# Patient Record
Sex: Male | Born: 2000 | Hispanic: No | Marital: Single | Smoking: Never smoker
Health system: Southern US, Community
[De-identification: ages and names within clinical notes are randomized; demographics above are authoritative.]

## PROBLEM LIST (undated history)

## (undated) DIAGNOSIS — Z789 Other specified health status: Secondary | ICD-10-CM

## (undated) HISTORY — DX: Other specified health status: Z78.9

---
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
## 2019-03-27 DIAGNOSIS — Z20828 Contact with and (suspected) exposure to other viral communicable diseases: Secondary | ICD-10-CM | POA: Diagnosis not present

## 2019-04-02 DIAGNOSIS — Z20828 Contact with and (suspected) exposure to other viral communicable diseases: Secondary | ICD-10-CM | POA: Diagnosis not present

## 2019-04-09 DIAGNOSIS — Z20828 Contact with and (suspected) exposure to other viral communicable diseases: Secondary | ICD-10-CM | POA: Diagnosis not present

## 2019-07-16 ENCOUNTER — Other Ambulatory Visit: Payer: Self-pay

## 2019-07-16 ENCOUNTER — Encounter (HOSPITAL_COMMUNITY): Payer: Self-pay | Admitting: Emergency Medicine

## 2019-07-16 ENCOUNTER — Inpatient Hospital Stay (HOSPITAL_COMMUNITY)
Admission: EM | Admit: 2019-07-16 | Discharge: 2019-07-20 | DRG: 343 | Disposition: A | Discharge status: Home / Self Care | Payer: Medicaid Other | Attending: Surgery | Admitting: Surgery

## 2019-07-16 DIAGNOSIS — K3532 Acute appendicitis with perforation and localized peritonitis, without abscess: Secondary | ICD-10-CM | POA: Diagnosis present

## 2019-07-16 DIAGNOSIS — K3580 Unspecified acute appendicitis: Secondary | ICD-10-CM | POA: Diagnosis present

## 2019-07-16 DIAGNOSIS — F17200 Nicotine dependence, unspecified, uncomplicated: Secondary | ICD-10-CM | POA: Diagnosis present

## 2019-07-16 DIAGNOSIS — Z8349 Family history of other endocrine, nutritional and metabolic diseases: Secondary | ICD-10-CM

## 2019-07-16 DIAGNOSIS — Z03818 Encounter for observation for suspected exposure to other biological agents ruled out: Secondary | ICD-10-CM | POA: Diagnosis not present

## 2019-07-16 DIAGNOSIS — Z8249 Family history of ischemic heart disease and other diseases of the circulatory system: Secondary | ICD-10-CM

## 2019-07-16 DIAGNOSIS — Z833 Family history of diabetes mellitus: Secondary | ICD-10-CM

## 2019-07-16 DIAGNOSIS — K352 Acute appendicitis with generalized peritonitis, without abscess: Secondary | ICD-10-CM

## 2019-07-16 DIAGNOSIS — R1031 Right lower quadrant pain: Secondary | ICD-10-CM

## 2019-07-16 DIAGNOSIS — R1110 Vomiting, unspecified: Secondary | ICD-10-CM

## 2019-07-16 DIAGNOSIS — Z20828 Contact with and (suspected) exposure to other viral communicable diseases: Secondary | ICD-10-CM | POA: Diagnosis present

## 2019-07-16 DIAGNOSIS — Z8619 Personal history of other infectious and parasitic diseases: Secondary | ICD-10-CM

## 2019-07-16 LAB — COMPREHENSIVE METABOLIC PANEL
ALT: 16 U/L (ref 0–44)
AST: 36 U/L (ref 15–41)
Albumin: 4.7 g/dL (ref 3.5–5.0)
Alkaline Phosphatase: 63 U/L (ref 38–126)
Anion gap: 13 (ref 5–15)
BUN: 8 mg/dL (ref 6–20)
CO2: 24 mmol/L (ref 22–32)
Calcium: 9.6 mg/dL (ref 8.9–10.3)
Chloride: 101 mmol/L (ref 98–111)
Creatinine, Ser: 1.15 mg/dL (ref 0.61–1.24)
GFR calc Af Amer: >60 mL/min (ref >60)
GFR calc non Af Amer: >60 mL/min (ref >60)
Glucose, Bld: 117 mg/dL — ABNORMAL HIGH (ref 70–99)
Potassium: 4.7 mmol/L (ref 3.5–5.1)
Sodium: 138 mmol/L (ref 135–145)
Total Bilirubin: 1.7 mg/dL — ABNORMAL HIGH (ref 0.3–1.2)
Total Protein: 7.7 g/dL (ref 6.5–8.1)

## 2019-07-16 LAB — CBC
HCT: 48.1 % (ref 39.0–52.0)
Hemoglobin: 16.4 g/dL (ref 13.0–17.0)
MCH: 31.6 pg (ref 26.0–34.0)
MCHC: 34.1 g/dL (ref 30.0–36.0)
MCV: 92.7 fL (ref 80.0–100.0)
Platelets: 239 10*3/uL (ref 150–400)
RBC: 5.19 MIL/uL (ref 4.22–5.81)
RDW: 11.8 % (ref 11.5–15.5)
WBC: 11.8 10*3/uL — ABNORMAL HIGH (ref 4.0–10.5)
nRBC: 0 % (ref 0.0–0.2)

## 2019-07-16 LAB — URINALYSIS, ROUTINE W REFLEX MICROSCOPIC
Bilirubin Urine: NEGATIVE
Glucose, UA: NEGATIVE mg/dL
Hgb urine dipstick: NEGATIVE
Ketones, ur: 80 mg/dL — AB
Leukocytes,Ua: NEGATIVE
Nitrite: NEGATIVE
Protein, ur: NEGATIVE mg/dL
Specific Gravity, Urine: 1.023 (ref 1.005–1.030)
pH: 7 (ref 5.0–8.0)

## 2019-07-16 LAB — LIPASE, BLOOD: Lipase: 22 U/L (ref 11–51)

## 2019-07-16 MED ORDER — SODIUM CHLORIDE 0.9% FLUSH: 3.0000 mL | Freq: Once | INTRAVENOUS | Status: DC

## 2019-07-16 MED ORDER — ONDANSETRON 4 MG PO TBDP
4.0000 mg | ORAL_TABLET | Freq: Once | ORAL | Status: AC | PRN
  Administered 2019-07-16: 4 mg via ORAL
  Filled 2019-07-16: qty 1

## 2019-07-16 NOTE — ED Notes (Signed)
Pt approached this tech stating that he was feeling really bad and felt like he was going to pass out. RN, Kathie Rhodes., notified. Pt's vitals were reassessed and he is currently sitting with this tech at the nurses station.

## 2019-07-16 NOTE — ED Triage Notes (Signed)
Pt has had abdominal pain and emesis since last night.  OTC medications have not assisted him.

## 2019-07-17 ENCOUNTER — Encounter (HOSPITAL_COMMUNITY): Payer: Self-pay | Admitting: Orthopedic Surgery

## 2019-07-17 ENCOUNTER — Emergency Department (HOSPITAL_COMMUNITY): Payer: Medicaid Other

## 2019-07-17 ENCOUNTER — Observation Stay (HOSPITAL_COMMUNITY): Payer: Medicaid Other | Admitting: Anesthesiology

## 2019-07-17 ENCOUNTER — Encounter (HOSPITAL_COMMUNITY): Admission: EM | Disposition: A | Discharge status: Home / Self Care | Payer: Self-pay | Source: Home / Self Care

## 2019-07-17 DIAGNOSIS — K3580 Unspecified acute appendicitis: Secondary | ICD-10-CM | POA: Diagnosis present

## 2019-07-17 DIAGNOSIS — K3589 Other acute appendicitis without perforation or gangrene: Secondary | ICD-10-CM | POA: Diagnosis not present

## 2019-07-17 DIAGNOSIS — R1031 Right lower quadrant pain: Secondary | ICD-10-CM | POA: Diagnosis not present

## 2019-07-17 DIAGNOSIS — K3532 Acute appendicitis with perforation and localized peritonitis, without abscess: Secondary | ICD-10-CM | POA: Diagnosis not present

## 2019-07-17 HISTORY — PX: LAPAROSCOPIC APPENDECTOMY: SHX408

## 2019-07-17 LAB — SARS CORONAVIRUS 2 (TAT 6-24 HRS): SARS Coronavirus 2: NEGATIVE

## 2019-07-17 LAB — HIV ANTIBODY (ROUTINE TESTING W REFLEX): HIV Screen 4th Generation wRfx: NONREACTIVE

## 2019-07-17 SURGERY — APPENDECTOMY, LAPAROSCOPIC
Anesthesia: General | Site: Abdomen

## 2019-07-17 MED ORDER — BUPIVACAINE-EPINEPHRINE (PF) 0.5% -1:200000 IJ SOLN
INTRAMUSCULAR | Status: DC | PRN
  Administered 2019-07-17: 7 mL

## 2019-07-17 MED ORDER — LIDOCAINE HCL (CARDIAC) PF 100 MG/5ML IV SOSY
PREFILLED_SYRINGE | INTRAVENOUS | Status: DC | PRN
  Administered 2019-07-17: 200 mg via INTRAVENOUS

## 2019-07-17 MED ORDER — MORPHINE SULFATE (PF) 2 MG/ML IV SOLN
2.0000 mg | INTRAVENOUS | Status: DC | PRN
  Administered 2019-07-17: 2 mg via INTRAVENOUS
  Filled 2019-07-17: qty 1

## 2019-07-17 MED ORDER — SODIUM CHLORIDE 0.9 % IV SOLN
2.0000 g | INTRAVENOUS | Status: DC
  Administered 2019-07-17: 2 g via INTRAVENOUS
  Filled 2019-07-17: qty 20

## 2019-07-17 MED ORDER — BUPIVACAINE-EPINEPHRINE 0.5% -1:200000 IJ SOLN
INTRAMUSCULAR | Status: AC
  Filled 2019-07-17: qty 1

## 2019-07-17 MED ORDER — ROCURONIUM BROMIDE 50 MG/5ML IV SOSY
PREFILLED_SYRINGE | INTRAVENOUS | Status: DC | PRN
  Administered 2019-07-17: 50 mg via INTRAVENOUS

## 2019-07-17 MED ORDER — SUGAMMADEX SODIUM 200 MG/2ML IV SOLN
INTRAVENOUS | Status: DC | PRN
  Administered 2019-07-17: 200 mg via INTRAVENOUS

## 2019-07-17 MED ORDER — HYDROMORPHONE HCL 1 MG/ML IJ SOLN
INTRAMUSCULAR | Status: AC
  Administered 2019-07-17: 0.5 mg via INTRAVENOUS
  Filled 2019-07-17: qty 1

## 2019-07-17 MED ORDER — ONDANSETRON HCL 4 MG/2ML IJ SOLN: 4.0000 mg | Freq: Four times a day (QID) | INTRAMUSCULAR | Status: DC | PRN

## 2019-07-17 MED ORDER — SODIUM CHLORIDE 0.9 % IV SOLN
INTRAVENOUS | Status: DC
  Administered 2019-07-17 – 2019-07-18 (×3): via INTRAVENOUS

## 2019-07-17 MED ORDER — SODIUM CHLORIDE 0.9 % IV SOLN
INTRAVENOUS | Status: DC
  Administered 2019-07-19: 15:00:00 via INTRAVENOUS

## 2019-07-17 MED ORDER — METRONIDAZOLE IN NACL 5-0.79 MG/ML-% IV SOLN
500.0000 mg | Freq: Three times a day (TID) | INTRAVENOUS | Status: DC
  Administered 2019-07-17: 500 mg via INTRAVENOUS
  Filled 2019-07-17: qty 100

## 2019-07-17 MED ORDER — SODIUM CHLORIDE 0.9 % IV SOLN
INTRAVENOUS | Status: DC
  Administered 2019-07-17: 12:00:00 via INTRAVENOUS

## 2019-07-17 MED ORDER — PIPERACILLIN-TAZOBACTAM 3.375 G IVPB
3.3750 g | Freq: Three times a day (TID) | INTRAVENOUS | Status: DC
  Administered 2019-07-17 – 2019-07-20 (×8): 3.375 g via INTRAVENOUS
  Filled 2019-07-17 (×8): qty 50

## 2019-07-17 MED ORDER — SODIUM CHLORIDE 0.9 % IV BOLUS
1000.0000 mL | Freq: Once | INTRAVENOUS | Status: AC
  Administered 2019-07-17: 1000 mL via INTRAVENOUS

## 2019-07-17 MED ORDER — IOHEXOL 300 MG/ML  SOLN
100.0000 mL | Freq: Once | INTRAMUSCULAR | Status: AC | PRN
  Administered 2019-07-17: 100 mL via INTRAVENOUS

## 2019-07-17 MED ORDER — ENOXAPARIN SODIUM 40 MG/0.4ML ~~LOC~~ SOLN
40.0000 mg | SUBCUTANEOUS | Status: DC
  Administered 2019-07-18 – 2019-07-20 (×3): 40 mg via SUBCUTANEOUS
  Filled 2019-07-17 (×3): qty 0.4

## 2019-07-17 MED ORDER — DEXAMETHASONE SODIUM PHOSPHATE 10 MG/ML IJ SOLN
INTRAMUSCULAR | Status: DC | PRN
  Administered 2019-07-17: 10 mg via INTRAVENOUS

## 2019-07-17 MED ORDER — DOCUSATE SODIUM 100 MG PO CAPS: 100.0000 mg | ORAL_CAPSULE | Freq: Two times a day (BID) | ORAL | Status: DC

## 2019-07-17 MED ORDER — LIDOCAINE 2% (20 MG/ML) 5 ML SYRINGE
INTRAMUSCULAR | Status: DC | PRN
  Administered 2019-07-17: 100 mg via INTRAVENOUS

## 2019-07-17 MED ORDER — FENTANYL CITRATE (PF) 250 MCG/5ML IJ SOLN
INTRAMUSCULAR | Status: AC
  Filled 2019-07-17: qty 5

## 2019-07-17 MED ORDER — METHOCARBAMOL 500 MG PO TABS: 500.0000 mg | ORAL_TABLET | Freq: Four times a day (QID) | ORAL | Status: DC | PRN

## 2019-07-17 MED ORDER — ONDANSETRON 4 MG PO TBDP: 4.0000 mg | ORAL_TABLET | Freq: Four times a day (QID) | ORAL | Status: DC | PRN

## 2019-07-17 MED ORDER — ACETAMINOPHEN 650 MG RE SUPP: 650.0000 mg | Freq: Four times a day (QID) | RECTAL | Status: DC | PRN

## 2019-07-17 MED ORDER — MEPERIDINE HCL 25 MG/ML IJ SOLN: 6.2500 mg | INTRAMUSCULAR | Status: DC | PRN

## 2019-07-17 MED ORDER — DIPHENHYDRAMINE HCL 25 MG PO CAPS: 25.0000 mg | ORAL_CAPSULE | Freq: Four times a day (QID) | ORAL | Status: DC | PRN

## 2019-07-17 MED ORDER — KETOROLAC TROMETHAMINE 30 MG/ML IJ SOLN
INTRAMUSCULAR | Status: DC | PRN
  Administered 2019-07-17: 30 mg via INTRAVENOUS

## 2019-07-17 MED ORDER — PROMETHAZINE HCL 25 MG/ML IJ SOLN: 6.2500 mg | INTRAMUSCULAR | Status: DC | PRN

## 2019-07-17 MED ORDER — FENTANYL CITRATE (PF) 100 MCG/2ML IJ SOLN
50.0000 ug | INTRAMUSCULAR | Status: DC | PRN
  Administered 2019-07-17: 150 ug via INTRAVENOUS

## 2019-07-17 MED ORDER — METHOCARBAMOL 500 MG PO TABS
500.0000 mg | ORAL_TABLET | Freq: Four times a day (QID) | ORAL | Status: DC | PRN
  Administered 2019-07-17 – 2019-07-18 (×3): 500 mg via ORAL
  Filled 2019-07-17 (×3): qty 1

## 2019-07-17 MED ORDER — POLYETHYLENE GLYCOL 3350 17 G PO PACK: 17.0000 g | PACK | Freq: Every day | ORAL | Status: DC | PRN

## 2019-07-17 MED ORDER — ACETAMINOPHEN 500 MG PO TABS
1000.0000 mg | ORAL_TABLET | Freq: Four times a day (QID) | ORAL | Status: DC
  Administered 2019-07-17 – 2019-07-20 (×10): 1000 mg via ORAL
  Filled 2019-07-17 (×10): qty 2

## 2019-07-17 MED ORDER — KETOROLAC TROMETHAMINE 15 MG/ML IJ SOLN: 15.0000 mg | Freq: Four times a day (QID) | INTRAMUSCULAR | Status: DC | PRN

## 2019-07-17 MED ORDER — HYDROMORPHONE HCL 1 MG/ML IJ SOLN
0.2500 mg | INTRAMUSCULAR | Status: DC | PRN
  Administered 2019-07-17 (×2): 0.5 mg via INTRAVENOUS

## 2019-07-17 MED ORDER — METOPROLOL TARTRATE 5 MG/5ML IV SOLN: 5.0000 mg | Freq: Four times a day (QID) | INTRAVENOUS | Status: DC | PRN

## 2019-07-17 MED ORDER — ONDANSETRON HCL 4 MG/2ML IJ SOLN
INTRAMUSCULAR | Status: DC | PRN
  Administered 2019-07-17: 4 mg via INTRAVENOUS

## 2019-07-17 MED ORDER — 0.9 % SODIUM CHLORIDE (POUR BTL) OPTIME
TOPICAL | Status: DC | PRN
  Administered 2019-07-17: 1000 mL

## 2019-07-17 MED ORDER — SIMETHICONE 80 MG PO CHEW
40.0000 mg | CHEWABLE_TABLET | Freq: Four times a day (QID) | ORAL | Status: DC | PRN
  Administered 2019-07-18 (×2): 40 mg via ORAL
  Filled 2019-07-17 (×2): qty 1

## 2019-07-17 MED ORDER — SODIUM CHLORIDE 0.9 % IR SOLN
Status: DC | PRN
  Administered 2019-07-17: 1000 mL

## 2019-07-17 MED ORDER — DIPHENHYDRAMINE HCL 50 MG/ML IJ SOLN: 25.0000 mg | Freq: Four times a day (QID) | INTRAMUSCULAR | Status: DC | PRN

## 2019-07-17 MED ORDER — ENOXAPARIN SODIUM 40 MG/0.4ML ~~LOC~~ SOLN
40.0000 mg | SUBCUTANEOUS | Status: DC
  Administered 2019-07-17: 40 mg via SUBCUTANEOUS
  Filled 2019-07-17: qty 0.4

## 2019-07-17 MED ORDER — PANTOPRAZOLE SODIUM 40 MG IV SOLR: 40.0000 mg | Freq: Every day | INTRAVENOUS | Status: DC

## 2019-07-17 MED ORDER — PROPOFOL 10 MG/ML IV BOLUS
INTRAVENOUS | Status: AC
  Filled 2019-07-17: qty 20

## 2019-07-17 MED ORDER — ACETAMINOPHEN 325 MG PO TABS: 650.0000 mg | ORAL_TABLET | Freq: Four times a day (QID) | ORAL | Status: DC | PRN

## 2019-07-17 MED ORDER — OXYCODONE HCL 5 MG PO TABS
5.0000 mg | ORAL_TABLET | ORAL | Status: DC | PRN
  Administered 2019-07-18 – 2019-07-20 (×7): 10 mg via ORAL
  Filled 2019-07-17 (×7): qty 2

## 2019-07-17 MED ORDER — MORPHINE SULFATE (PF) 2 MG/ML IV SOLN: 2.0000 mg | INTRAVENOUS | Status: DC | PRN

## 2019-07-17 MED ORDER — MIDAZOLAM HCL 2 MG/2ML IJ SOLN
INTRAMUSCULAR | Status: AC
  Filled 2019-07-17: qty 2

## 2019-07-17 MED ORDER — ONDANSETRON HCL 4 MG/2ML IJ SOLN
4.0000 mg | Freq: Once | INTRAMUSCULAR | Status: AC
  Administered 2019-07-17: 4 mg via INTRAVENOUS
  Filled 2019-07-17: qty 2

## 2019-07-17 MED ORDER — SIMETHICONE 80 MG PO CHEW: 40.0000 mg | CHEWABLE_TABLET | Freq: Four times a day (QID) | ORAL | Status: DC | PRN

## 2019-07-17 MED ORDER — CEFAZOLIN SODIUM-DEXTROSE 2-3 GM-%(50ML) IV SOLR
INTRAVENOUS | Status: DC | PRN
  Administered 2019-07-17: 2 g via INTRAVENOUS

## 2019-07-17 MED ORDER — OXYCODONE HCL 5 MG PO TABS: 5.0000 mg | ORAL_TABLET | ORAL | Status: DC | PRN

## 2019-07-17 MED ORDER — LACTATED RINGERS IV SOLN
INTRAVENOUS | Status: DC
  Administered 2019-07-17: 16:00:00 via INTRAVENOUS

## 2019-07-17 SURGICAL SUPPLY — 42 items
APPLIER CLIP ROT 10 11.4 M/L (STAPLE)
CANISTER SUCT 3000ML PPV (MISCELLANEOUS) ×2 IMPLANT
CHLORAPREP W/TINT 26 (MISCELLANEOUS) ×2 IMPLANT
CLIP APPLIE ROT 10 11.4 M/L (STAPLE) IMPLANT
CLSR STERI-STRIP ANTIMIC 1/2X4 (GAUZE/BANDAGES/DRESSINGS) ×2 IMPLANT
COVER SURGICAL LIGHT HANDLE (MISCELLANEOUS) ×2 IMPLANT
COVER WAND RF STERILE (DRAPES) IMPLANT
CUTTER FLEX LINEAR 45M (STAPLE) ×2 IMPLANT
DERMABOND ADVANCED (GAUZE/BANDAGES/DRESSINGS) ×1
DERMABOND ADVANCED .7 DNX12 (GAUZE/BANDAGES/DRESSINGS) ×1 IMPLANT
ELECT REM PT RETURN 9FT ADLT (ELECTROSURGICAL) ×2
ELECTRODE REM PT RTRN 9FT ADLT (ELECTROSURGICAL) ×1 IMPLANT
GLOVE BIO SURGEON STRL SZ7 (GLOVE) ×2 IMPLANT
GLOVE BIOGEL PI IND STRL 7.5 (GLOVE) ×1 IMPLANT
GLOVE BIOGEL PI INDICATOR 7.5 (GLOVE) ×1
GOWN STRL REUS W/ TWL LRG LVL3 (GOWN DISPOSABLE) ×3 IMPLANT
GOWN STRL REUS W/TWL LRG LVL3 (GOWN DISPOSABLE) ×3
GRASPER SUT TROCAR 14GX15 (MISCELLANEOUS) ×2 IMPLANT
KIT BASIN OR (CUSTOM PROCEDURE TRAY) ×2 IMPLANT
KIT TURNOVER KIT B (KITS) ×2 IMPLANT
NS IRRIG 1000ML POUR BTL (IV SOLUTION) ×2 IMPLANT
PAD ARMBOARD 7.5X6 YLW CONV (MISCELLANEOUS) ×4 IMPLANT
POUCH RETRIEVAL ECOSAC 10 (ENDOMECHANICALS) ×1 IMPLANT
POUCH RETRIEVAL ECOSAC 10MM (ENDOMECHANICALS) ×1
RELOAD 45 VASCULAR/THIN (ENDOMECHANICALS) IMPLANT
RELOAD STAPLE TA45 3.5 REG BLU (ENDOMECHANICALS) ×2 IMPLANT
SCISSORS LAP 5X35 DISP (ENDOMECHANICALS) IMPLANT
SET IRRIG TUBING LAPAROSCOPIC (IRRIGATION / IRRIGATOR) ×2 IMPLANT
SET TUBE SMOKE EVAC HIGH FLOW (TUBING) ×2 IMPLANT
SHEARS HARMONIC ACE PLUS 36CM (ENDOMECHANICALS) ×2 IMPLANT
SLEEVE ENDOPATH XCEL 5M (ENDOMECHANICALS) ×2 IMPLANT
SPECIMEN JAR SMALL (MISCELLANEOUS) ×2 IMPLANT
STRIP CLOSURE SKIN 1/2X4 (GAUZE/BANDAGES/DRESSINGS) ×2 IMPLANT
SUT MNCRL AB 4-0 PS2 18 (SUTURE) ×2 IMPLANT
SUT VICRYL 0 UR6 27IN ABS (SUTURE) ×2 IMPLANT
TOWEL GREEN STERILE (TOWEL DISPOSABLE) ×2 IMPLANT
TOWEL GREEN STERILE FF (TOWEL DISPOSABLE) ×2 IMPLANT
TRAY FOLEY MTR SLVR 16FR STAT (SET/KITS/TRAYS/PACK) IMPLANT
TRAY LAPAROSCOPIC MC (CUSTOM PROCEDURE TRAY) ×2 IMPLANT
TROCAR XCEL BLUNT TIP 100MML (ENDOMECHANICALS) ×2 IMPLANT
TROCAR XCEL NON-BLD 5MMX100MML (ENDOMECHANICALS) ×2 IMPLANT
WATER STERILE IRR 1000ML POUR (IV SOLUTION) ×2 IMPLANT

## 2019-07-17 NOTE — Anesthesia Preprocedure Evaluation (Signed)
Anesthesia Evaluation  Patient identified by MRN, date of birth, ID band Patient awake    Reviewed: Allergy & Precautions, NPO status , Patient's Chart, lab work & pertinent test results  Airway Mallampati: I  TM Distance: >3 FB Neck ROM: Full    Dental no notable dental hx. (+) Dental Advisory Given   Pulmonary neg pulmonary ROS,    Pulmonary exam normal breath sounds clear to auscultation       Cardiovascular negative cardio ROS Normal cardiovascular exam Rhythm:Regular Rate:Normal     Neuro/Psych negative neurological ROS  negative psych ROS   GI/Hepatic negative GI ROS, Neg liver ROS,   Endo/Other  negative endocrine ROS  Renal/GU negative Renal ROS     Musculoskeletal negative musculoskeletal ROS (+)   Abdominal   Peds  Hematology negative hematology ROS (+)   Anesthesia Other Findings   Reproductive/Obstetrics                             Anesthesia Physical Anesthesia Plan  ASA: I and emergent  Anesthesia Plan: General   Post-op Pain Management:    Induction: Intravenous, Rapid sequence and Cricoid pressure planned  PONV Risk Score and Plan: 4 or greater and Ondansetron, Dexamethasone, Midazolam and Treatment may vary due to age or medical condition  Airway Management Planned: Oral ETT  Additional Equipment: None  Intra-op Plan:   Post-operative Plan: Extubation in OR  Informed Consent: I have reviewed the patients History and Physical, chart, labs and discussed the procedure including the risks, benefits and alternatives for the proposed anesthesia with the patient or authorized representative who has indicated his/her understanding and acceptance.     Dental advisory given  Plan Discussed with: CRNA  Anesthesia Plan Comments:         Anesthesia Quick Evaluation

## 2019-07-17 NOTE — Progress Notes (Signed)
Received pt here in 6N from PACU, alert orientedx4 with vitals signs stable. Lap sites clean and dry, pt oriented to room, bed controls and plan of care. Left lying comfortably in bed with call bell at reach, will continue to monitor.

## 2019-07-17 NOTE — ED Notes (Signed)
Surgery with patient in triage.

## 2019-07-17 NOTE — Interval H&P Note (Signed)
History and Physical Interval Note:  07/17/2019 4:45 PM I have seen and examined patient. Reviewed imaging plan for lap appy Lee Santos  has presented today for surgery, with the diagnosis of acute appendicitis.  The various methods of treatment have been discussed with the patient and family. After consideration of risks, benefits and other options for treatment, the patient has consented to  Procedure(s): APPENDECTOMY LAPAROSCOPIC (N/A) as a surgical intervention.  The patient's history has been reviewed, patient examined, no change in status, stable for surgery.  I have reviewed the patient's chart and labs.  Questions were answered to the patient's satisfaction.     Rolm Bookbinder

## 2019-07-17 NOTE — Transfer of Care (Signed)
Immediate Anesthesia Transfer of Care Note  Patient: Lee Santos  Procedure(s) Performed: APPENDECTOMY LAPAROSCOPIC (N/A Abdomen)  Patient Location: PACU  Anesthesia Type:General  Level of Consciousness: awake, alert  and oriented  Airway & Oxygen Therapy: Patient Spontanous Breathing  Post-op Assessment: Report given to RN and Post -op Vital signs reviewed and stable  Post vital signs: Reviewed and stable  Last Vitals:  Vitals Value Taken Time  BP 126/57 07/17/19 1754  Temp    Pulse 96 07/17/19 1755  Resp 19 07/17/19 1755  SpO2 100 % 07/17/19 1755  Vitals shown include unvalidated device data.  Last Pain:  Vitals:   07/17/19 1321  TempSrc: Oral  PainSc:          Complications: No apparent anesthesia complications

## 2019-07-17 NOTE — Op Note (Signed)
Preoperative diagnosis:acute appendicitis Postoperative diagnosis: perforated appendicitis  Procedure: Laparoscopic appendectomy Surgeon: Dr. Serita Grammes Anesthesia: General Specimens: appendix to pathology Complications: None Drains: none Sponge needle count was correct at completion Disposition to recovery stable condition  Indications: This is a27 yom with rlq pain, elevated wbc and a ct scan that shows appendicitis. I recommended proceeding with appendectomy today.   Procedure: After informed consent was obtained the patientwas taken to the OR. Antibiotics had been given. SCDs were in place. He was then prepped and draped in the standard sterile surgical fashion after already undergoing general anesthesia.Surgical timeout was then performed.  Iinfiltrated Marcaine below the umbilicus. I made a vertical incision. I grasped the fascia and incised this sharply. The peritoneum was entered bluntly. I then placed a 0 Vicryl pursestring suture through the fascia. I inserted a Hassan trocar and insufflated the abdomen to 15 mmHg pressure.  I then inserted 2 additional 5 mm trocars in the lower abdomen under direct vision.  The appendiceal base was easily visualized. I did have to use the harmonic scalpel to take down the white line to medialize the cecum.  The appendix was retrocecal and going cephalad towards the liver. The distal half was necrotic.  I used the harmonic scalpel to divide the mesentery.  I then used a GIA stapler to divide the base.  The base was healthy.  I then placed this in a retrieval bag and removed it from the umbilicus.  Hemostasis was then observed.  I irrigated. I then removed the [facility] trocar and tied down my pursestring.  I did placeadditional 0 Vicryl sutures using the suture passer device at the umbilicus. The remaining trocars were removed and the abdomen was desufflated. These were all closed with 4-0 Monocryl, glue, and Steri-Strips. He  tolerated this well was extubated and transferred to recovery stable.

## 2019-07-17 NOTE — Anesthesia Procedure Notes (Signed)
Procedure Name: Intubation Date/Time: 07/17/2019 5:01 PM Performed by: Eligha Bridegroom, CRNA Pre-anesthesia Checklist: Patient identified, Emergency Drugs available, Suction available, Patient being monitored and Timeout performed Patient Re-evaluated:Patient Re-evaluated prior to induction Oxygen Delivery Method: Circle system utilized Preoxygenation: Pre-oxygenation with 100% oxygen Induction Type: IV induction Ventilation: Mask ventilation without difficulty Laryngoscope Size: Mac and 3 Grade View: Grade I Number of attempts: 1 Airway Equipment and Method: Stylet Placement Confirmation: ETT inserted through vocal cords under direct vision,  positive ETCO2 and breath sounds checked- equal and bilateral Secured at: 21 cm Tube secured with: Tape Dental Injury: Teeth and Oropharynx as per pre-operative assessment

## 2019-07-17 NOTE — H&P (Signed)
[facility] Surgery Admission Note  Lee Santos 09-21-2000  [id number].    Requesting MD: Dr. Reather Converse  Chief Complaint: RLQ abdominal pain, N/V Reason for Consult: Acute appendicitis   HPI: Lee Santos is a 18 y.o. male with no significant past medical history who presented to [facility] for RLQ abdominal pain, subjective fevers, and N/V.  Patient reports that Wednesday night he began having suprapubic abdominal pain that was gradual in onset, constant, mild, dull.  He reports that the pain persisted overnight and he began having associated subjective fevers, nausea, vomiting and diarrhea.  Patient reports he has had 6 episodes of nonbilious, nonbloody emesis since onset, with the last episode last night.  He reports he has had one episode of diarrhea since onset, yesterday.  He tried Alka-Seltzer for symptoms without relief.  He presented to the ED for evaluation.  While in the waiting room his pain began to migrate to the right lower quadrant and become more severe. Pain is worse with movement and palpation. Patient underwent work-up in the ED that revealed a leukocytosis of 11,800 and a CT A/P consistent with acute appendicitis without rupture or abscess. General surgery was asked to see.   Patient reports he was diagnosed with Covid back in July.  He did not require hospitalization.  He has had a negative test since that time.  Currently denies any chest pain, shortness of breath or cough.  He denies any other past medical history.  He has not take any daily medications.  No prior past surgical history.  He is not on blood thinners.  Patient lives at home with his mother and siblings.  He reports he is currently going to [employer] for [employer]. He is currently not employed.  He reports that he does smoke hookah occasionally.  No other tobacco use.  He denies any illicit drug use.  He reports occasional alcohol use on the weekends.  ROS: Review of Systems  Constitutional:  Positive for fever. Negative for chills.  Respiratory: Negative for cough and shortness of breath.   Cardiovascular: Negative for chest pain and leg swelling.  Gastrointestinal: Positive for abdominal pain, diarrhea, nausea and vomiting. Negative for constipation.  Genitourinary: Negative for dysuria and urgency.  Musculoskeletal: Negative for back pain.  All other systems reviewed and are negative. All systems reviewed and otherwise negative except for as above  Family History  Problem Relation Age of Onset  . Hypertension Father   . Hyperlipidemia Father   . Heart disease Paternal Grandfather   . Diabetes Maternal Grandfather     Past Medical History:  Diagnosis Date  . Medical history non-contributory     History reviewed. No pertinent surgical history.  Social History:  reports that he has never smoked. He has never used smokeless tobacco. No history on file for alcohol and drug.  Allergies: No Known Allergies  (Not in a hospital admission)   Prior to Admission medications   Not on File    Blood pressure (!) 116/59, pulse 90, temperature 100 F (37.8 C), temperature source Oral, resp. rate 17, height 5\' 11"  (1.803 m), SpO2 99 %. Physical Exam: General: pleasant, WD/WN male who is laying in bed in NAD HEENT: head is normocephalic, atraumatic.  Sclera are noninjected.  Pupils equal and round.  Ears and nose without any masses or lesions.  Mouth is pink and moist. Mask over face Heart: regular, rate, and rhythm.  No obvious murmurs, gallops, or rubs noted.  Palpable pedal  pulses bilaterally Lungs: CTAB, no wheezes, rhonchi, or rales noted.  Respiratory effort nonlabored Abd: Soft, ND, tenderness of RLQ with positive McBurney's point tenderness.  No peritonitis.  +BS, no masses, hernias, or organomegaly. No prior abdominal surgical scars.  MS: calves soft and nontender Skin: warm and dry with no masses, lesions, or rashes Psych: A&Ox3 with an appropriate affect. Neuro:  cranial nerves grossly intact, extremity CSM intact bilaterally, normal speech  Results for orders placed or performed during the hospital encounter of 07/16/19 (from the past 48 hour(s))  Lipase, blood     Status: None   Collection Time: 07/16/19  9:30 PM  Result Value Ref Range   Lipase 22 11 - 51 U/L    Comment: Performed at [facility], [address] [place], [place] [address]  Comprehensive metabolic panel     Status: Abnormal   Collection Time: 07/16/19  9:30 PM  Result Value Ref Range   Sodium 138 135 - 145 mmol/L   Potassium 4.7 3.5 - 5.1 mmol/L    Comment: SLIGHT HEMOLYSIS   Chloride 101 98 - 111 mmol/L   CO2 24 22 - 32 mmol/L   Glucose, Bld 117 (H) 70 - 99 mg/dL   BUN 8 6 - 20 mg/dL   Creatinine, Ser 6.041.15 0.61 - 1.24 mg/dL   Calcium 9.6 8.9 - 54.010.3 mg/dL   Total Protein 7.7 6.5 - 8.1 g/dL   Albumin 4.7 3.5 - 5.0 g/dL   AST 36 15 - 41 U/L   ALT 16 0 - 44 U/L   Alkaline Phosphatase 63 38 - 126 U/L   Total Bilirubin 1.7 (H) 0.3 - 1.2 mg/dL   GFR calc non Af Amer >60 >60 mL/min   GFR calc Af Amer >60 >60 mL/min   Anion gap 13 5 - 15    Comment: Performed at [facility], [address] [place], [place] [address]  CBC     Status: Abnormal   Collection Time: 07/16/19  9:30 PM  Result Value Ref Range   WBC 11.8 (H) 4.0 - 10.5 K/uL   RBC 5.19 4.22 - 5.81 MIL/uL   Hemoglobin 16.4 13.0 - 17.0 g/dL   HCT 14.748.1 82.939.0 - 56.252.0 %   MCV 92.7 80.0 - 100.0 fL   MCH 31.6 26.0 - 34.0 pg   MCHC 34.1 30.0 - 36.0 g/dL   RDW 13.011.8 86.511.5 - 78.415.5 %   Platelets 239 150 - 400 K/uL   nRBC 0.0 0.0 - 0.2 %    Comment: Performed at [facility], [address] [place], [place] [address]  Urinalysis, Routine w reflex microscopic     Status: Abnormal   Collection Time: 07/16/19  9:35 PM  Result Value Ref Range   Color, Urine YELLOW YELLOW   APPearance CLEAR CLEAR   Specific Gravity, Urine 1.023 1.005 - 1.030   pH 7.0 5.0 - 8.0   Glucose, UA NEGATIVE NEGATIVE mg/dL   Hgb  urine dipstick NEGATIVE NEGATIVE   Bilirubin Urine NEGATIVE NEGATIVE   Ketones, ur 80 (A) NEGATIVE mg/dL   Protein, ur NEGATIVE NEGATIVE mg/dL   Nitrite NEGATIVE NEGATIVE   Leukocytes,Ua NEGATIVE NEGATIVE    Comment: Performed at [facility], [address] [place], [place] [address]   Ct Abdomen Pelvis W Contrast  Result Date: 07/17/2019 CLINICAL DATA:  Acute right lower quadrant pain, fever. EXAM: CT ABDOMEN AND PELVIS WITH CONTRAST TECHNIQUE: Multidetector CT imaging of the abdomen and  pelvis was performed using the standard protocol following bolus administration of intravenous contrast. CONTRAST:  OMNIPAQUE IOHEXOL 300 MG/ML  SOLN COMPARISON:  None. FINDINGS: Lower chest: Lung bases are clear. Heart size normal. No pericardial or pleural effusion. Hepatobiliary: Liver and gallbladder are unremarkable. No biliary ductal dilatation. Pancreas: Negative. Spleen: Negative. Adrenals/Urinary Tract: Adrenal glands and kidneys are unremarkable. Ureters are decompressed. Bladder is grossly unremarkable. Stomach/Bowel: Stomach and small bowel are unremarkable. Appendix is dilated and fluid-filled, measuring up to 18 mm. There are at least 3 associated appendicoliths. Periappendiceal inflammatory stranding and fluid. No extraluminal air or organized fluid collection. Colon is unremarkable. Vascular/Lymphatic: Vascular structures are unremarkable. Reactive ileocolic mesenteric lymph nodes measure up to 8 mm. No pathologically enlarged lymph nodes. Reproductive: Prostate is visualized. Other: Small pelvic free fluid. Mesenteries and peritoneum are otherwise unremarkable. Musculoskeletal: Mild levoconvex curvature of the thoracolumbar spine may be positional. No worrisome lytic or sclerotic lesions. Presumed bone islands in both femoral heads. IMPRESSION: Acute appendicitis without evidence of rupture or abscess. Associated small pelvic free fluid. Electronically Signed   By: Leanna Battles M.D.    On: 07/17/2019 10:04   Anti-infectives (From admission, onward)   Start     Dose/Rate Route Frequency Ordered Stop   07/17/19 1115  cefTRIAXone (ROCEPHIN) 2 g in sodium chloride 0.9 % 100 mL IVPB     2 g 200 mL/hr over 30 Minutes Intravenous Every 24 hours 07/17/19 1104     07/17/19 1115  metroNIDAZOLE (FLAGYL) IVPB 500 mg     500 mg 100 mL/hr over 60 Minutes Intravenous Every 8 hours 07/17/19 1104        Assessment/Plan Acute appendicitis without rupture or abscess - CT scan reviewed personally. Will plan for IV abx and OR today.  I have discussed the procedure and risks of appendectomy. The risks include but are not limited to bleeding, infection, wound problems, anesthesia, injury to intra-abdominal organs, possibility of postoperative ileus. He seems to understand and agrees with the plan.   ID - Rocephin/Flagyl VTE - SCDs, Lovenox, Mobilize FEN - NPO, IVF  Jacinto Halim, [facility] Surgery 07/17/2019, 10:49 AM Please see Amion for pager number during day hours 7:00am-4:30pm

## 2019-07-17 NOTE — ED Provider Notes (Signed)
[facility] EMERGENCY DEPARTMENT Provider Note   CSN: [id number] Arrival date & time: 07/16/19  2053     History   Chief Complaint Chief Complaint  Patient presents with  . Abdominal Pain  . Emesis    HPI Lee Santos is a 18 y.o. male.     Patient with no significant medical history presents with worsening abdominal pain and vomiting since yesterday.  No history of surgeries.  Pain now is moved to the right lower quadrant.  No testicular pain.  No urinary symptoms.  Patient feels feverish.     Past Medical History:  Diagnosis Date  . Medical history non-contributory     Patient Active Problem List   Diagnosis Date Noted  . Weight loss 03/26/2017    History reviewed. No pertinent surgical history.      Home Medications    Prior to Admission medications   Not on File    Family History Family History  Problem Relation Age of Onset  . Hypertension Father   . Hyperlipidemia Father   . Heart disease Paternal Grandfather   . Diabetes Maternal Grandfather     Social History Social History   Tobacco Use  . Smoking status: Never Smoker  . Smokeless tobacco: Never Used  Substance Use Topics  . Alcohol use: Not on file  . Drug use: Not on file     Allergies   Patient has no known allergies.   Review of Systems Review of Systems  Constitutional: Negative for chills and fever.  HENT: Negative for congestion.   Eyes: Negative for visual disturbance.  Respiratory: Negative for shortness of breath.   Cardiovascular: Negative for chest pain.  Gastrointestinal: Positive for abdominal pain and nausea. Negative for vomiting.  Genitourinary: Negative for dysuria and flank pain.  Musculoskeletal: Negative for back pain, neck pain and neck stiffness.  Skin: Negative for rash.  Neurological: Positive for light-headedness. Negative for headaches.     Physical Exam Updated Vital Signs BP (!) 116/59 (BP Location: Right Arm)    Pulse 90   Temp 100 F (37.8 C) (Oral)   Resp 17   Ht 5\' 11"  (1.803 m)   SpO2 99%   Physical Exam Vitals signs and nursing note reviewed.  Constitutional:      Appearance: He is well-developed.  HENT:     Head: Normocephalic and atraumatic.  Eyes:     General:        Right eye: No discharge.        Left eye: No discharge.     Conjunctiva/sclera: Conjunctivae normal.  Neck:     Musculoskeletal: Normal range of motion and neck supple.     Trachea: No tracheal deviation.  Cardiovascular:     Rate and Rhythm: Regular rhythm. Tachycardia present.  Pulmonary:     Effort: Pulmonary effort is normal.     Breath sounds: Normal breath sounds.  Abdominal:     General: There is no distension.     Palpations: Abdomen is soft.     Tenderness: There is abdominal tenderness. There is guarding.  Skin:    General: Skin is warm.     Capillary Refill: Capillary refill takes less than 2 seconds.     Findings: No rash.  Neurological:     General: No focal deficit present.     Mental Status: He is alert and oriented to person, place, and time.      ED Treatments / Results  Labs (all labs ordered  are listed, but only abnormal results are displayed) Labs Reviewed  COMPREHENSIVE METABOLIC PANEL - Abnormal; Notable for the following components:      Result Value   Glucose, Bld 117 (*)    Total Bilirubin 1.7 (*)    All other components within normal limits  CBC - Abnormal; Notable for the following components:   WBC 11.8 (*)    All other components within normal limits  URINALYSIS, ROUTINE W REFLEX MICROSCOPIC - Abnormal; Notable for the following components:   Ketones, ur 80 (*)    All other components within normal limits  SARS CORONAVIRUS 2 (TAT 6-24 HRS)  LIPASE, BLOOD    EKG None  Radiology Ct Abdomen Pelvis W Contrast  Result Date: 07/17/2019 CLINICAL DATA:  Acute right lower quadrant pain, fever. EXAM: CT ABDOMEN AND PELVIS WITH CONTRAST TECHNIQUE: Multidetector CT  imaging of the abdomen and pelvis was performed using the standard protocol following bolus administration of intravenous contrast. CONTRAST:  OMNIPAQUE IOHEXOL 300 MG/ML  SOLN COMPARISON:  None. FINDINGS: Lower chest: Lung bases are clear. Heart size normal. No pericardial or pleural effusion. Hepatobiliary: Liver and gallbladder are unremarkable. No biliary ductal dilatation. Pancreas: Negative. Spleen: Negative. Adrenals/Urinary Tract: Adrenal glands and kidneys are unremarkable. Ureters are decompressed. Bladder is grossly unremarkable. Stomach/Bowel: Stomach and small bowel are unremarkable. Appendix is dilated and fluid-filled, measuring up to 18 mm. There are at least 3 associated appendicoliths. Periappendiceal inflammatory stranding and fluid. No extraluminal air or organized fluid collection. Colon is unremarkable. Vascular/Lymphatic: Vascular structures are unremarkable. Reactive ileocolic mesenteric lymph nodes measure up to 8 mm. No pathologically enlarged lymph nodes. Reproductive: Prostate is visualized. Other: Small pelvic free fluid. Mesenteries and peritoneum are otherwise unremarkable. Musculoskeletal: Mild levoconvex curvature of the thoracolumbar spine may be positional. No worrisome lytic or sclerotic lesions. Presumed bone islands in both femoral heads. IMPRESSION: Acute appendicitis without evidence of rupture or abscess. Associated small pelvic free fluid. Electronically Signed   By: Leanna Battles M.D.   On: 07/17/2019 10:04    Procedures Procedures (including critical care time)  Medications Ordered in ED Medications  sodium chloride flush (NS) 0.9 % injection 3 mL (has no administration in time range)  ondansetron (ZOFRAN) injection 4 mg (has no administration in time range)  ondansetron (ZOFRAN-ODT) disintegrating tablet 4 mg (4 mg Oral Given 07/16/19 2127)  sodium chloride 0.9 % bolus 1,000 mL (1,000 mLs Intravenous New Bag/Given 07/17/19 0904)  iohexol (OMNIPAQUE)  300 MG/ML solution 100 mL (100 mLs Intravenous Contrast Given 07/17/19 0941)     Initial Impression / Assessment and Plan / ED Course  I have reviewed the triage vital signs and the nursing notes.  Pertinent labs & imaging results that were available during my care of the patient were reviewed by me and considered in my medical decision making (see chart for details).       Patient presents with worsening abdominal pain vomiting with pain initially central now right lower quadrant pain consistent with acute appendicitis.  CT scan ordered, Zofran, IV fluids.  Patient currently in the waiting room.   Discussed with radiology CT scan is positive for appendicitis.  Discussed with general surgery who will see the patient in triage.  Plan for admission for the operating room later today.  Urinalysis reviewed showing ketones consistent with patient's dehydration.  Mild leukocytosis 11.8.  Patient stable for admission. IV fluids and pain meds.  Updated patient.  Final Clinical Impressions(s) / ED Diagnoses   Final diagnoses:  Right lower quadrant abdominal pain  Vomiting in adult  Acute appendicitis with generalized peritonitis, unspecified whether abscess present, unspecified whether gangrene present, unspecified whether perforation present    ED Discharge Orders    None       Blane OharaZavitz, Ethan Clayburn, MD 07/17/19 1100

## 2019-07-17 NOTE — ED Notes (Signed)
Pt not wanting nausea meds at this time

## 2019-07-18 DIAGNOSIS — Z20828 Contact with and (suspected) exposure to other viral communicable diseases: Secondary | ICD-10-CM | POA: Diagnosis present

## 2019-07-18 DIAGNOSIS — R112 Nausea with vomiting, unspecified: Secondary | ICD-10-CM | POA: Diagnosis present

## 2019-07-18 DIAGNOSIS — Z8619 Personal history of other infectious and parasitic diseases: Secondary | ICD-10-CM | POA: Diagnosis not present

## 2019-07-18 DIAGNOSIS — Z8349 Family history of other endocrine, nutritional and metabolic diseases: Secondary | ICD-10-CM | POA: Diagnosis not present

## 2019-07-18 DIAGNOSIS — R1031 Right lower quadrant pain: Secondary | ICD-10-CM | POA: Diagnosis not present

## 2019-07-18 DIAGNOSIS — Z8249 Family history of ischemic heart disease and other diseases of the circulatory system: Secondary | ICD-10-CM | POA: Diagnosis not present

## 2019-07-18 DIAGNOSIS — K352 Acute appendicitis with generalized peritonitis, without abscess: Secondary | ICD-10-CM | POA: Diagnosis present

## 2019-07-18 DIAGNOSIS — F17200 Nicotine dependence, unspecified, uncomplicated: Secondary | ICD-10-CM | POA: Diagnosis present

## 2019-07-18 DIAGNOSIS — Z833 Family history of diabetes mellitus: Secondary | ICD-10-CM | POA: Diagnosis not present

## 2019-07-18 LAB — CBC
HCT: 40.2 % (ref 39.0–52.0)
Hemoglobin: 13.8 g/dL (ref 13.0–17.0)
MCH: 31.4 pg (ref 26.0–34.0)
MCHC: 34.3 g/dL (ref 30.0–36.0)
MCV: 91.6 fL (ref 80.0–100.0)
Platelets: 184 10*3/uL (ref 150–400)
RBC: 4.39 MIL/uL (ref 4.22–5.81)
RDW: 11.7 % (ref 11.5–15.5)
WBC: 11.4 10*3/uL — ABNORMAL HIGH (ref 4.0–10.5)
nRBC: 0 % (ref 0.0–0.2)

## 2019-07-18 LAB — BASIC METABOLIC PANEL
Anion gap: 12 (ref 5–15)
BUN: 8 mg/dL (ref 6–20)
CO2: 24 mmol/L (ref 22–32)
Calcium: 8.9 mg/dL (ref 8.9–10.3)
Chloride: 103 mmol/L (ref 98–111)
Creatinine, Ser: 1.12 mg/dL (ref 0.61–1.24)
GFR calc Af Amer: >60 mL/min (ref >60)
GFR calc non Af Amer: >60 mL/min (ref >60)
Glucose, Bld: 145 mg/dL — ABNORMAL HIGH (ref 70–99)
Potassium: 4.2 mmol/L (ref 3.5–5.1)
Sodium: 139 mmol/L (ref 135–145)

## 2019-07-18 NOTE — Progress Notes (Signed)
1 Day Post-Op   Subjective/Chief Complaint: Moderate post op pain Denies nausea   Objective: Vital signs in last 24 hours: Temp:  [97.7 F (36.5 C)-100.1 F (37.8 C)] 97.7 F (36.5 C) (11/14 0540) Pulse Rate:  [58-96] 58 (11/14 0540) Resp:  [15-20] 18 (11/13 2103) BP: (108-131)/(42-71) 108/56 (11/14 0540) SpO2:  [98 %-100 %] 100 % (11/14 0540) Weight:  [70.3 kg] 70.3 kg (11/13 1533) Last BM Date: (pta)  Intake/Output from previous day: 11/13 0701 - 11/14 0700 In: 1578.1 [P.O.:222; I.V.:1006.1; IV Piggyback:250] Out: 0  Intake/Output this shift: No intake/output data recorded.  Exam: Awake and alert Abdomen soft, appropriately tender, incisions clean  Lab Results:  Recent Labs    07/16/19 2130 07/18/19 0321  WBC 11.8* 11.4*  HGB 16.4 13.8  HCT 48.1 40.2  PLT 239 184   BMET Recent Labs    07/16/19 2130 07/18/19 0321  NA 138 139  K 4.7 4.2  CL 101 103  CO2 24 24  GLUCOSE 117* 145*  BUN 8 8  CREATININE 1.15 1.12  CALCIUM 9.6 8.9   PT/INR No results for input(s): LABPROT, INR in the last 72 hours. ABG No results for input(s): PHART, HCO3 in the last 72 hours.  Invalid input(s): PCO2, PO2  Studies/Results: Ct Abdomen Pelvis W Contrast  Result Date: 07/17/2019 CLINICAL DATA:  Acute right lower quadrant pain, fever. EXAM: CT ABDOMEN AND PELVIS WITH CONTRAST TECHNIQUE: Multidetector CT imaging of the abdomen and pelvis was performed using the standard protocol following bolus administration of intravenous contrast. CONTRAST:  115mL OMNIPAQUE IOHEXOL 300 MG/ML  SOLN COMPARISON:  None. FINDINGS: Lower chest: Lung bases are clear. Heart size normal. No pericardial or pleural effusion. Hepatobiliary: Liver and gallbladder are unremarkable. No biliary ductal dilatation. Pancreas: Negative. Spleen: Negative. Adrenals/Urinary Tract: Adrenal glands and kidneys are unremarkable. Ureters are decompressed. Bladder is grossly unremarkable. Stomach/Bowel: Stomach and small  bowel are unremarkable. Appendix is dilated and fluid-filled, measuring up to 18 mm. There are at least 3 associated appendicoliths. Periappendiceal inflammatory stranding and fluid. No extraluminal air or organized fluid collection. Colon is unremarkable. Vascular/Lymphatic: Vascular structures are unremarkable. Reactive ileocolic mesenteric lymph nodes measure up to 8 mm. No pathologically enlarged lymph nodes. Reproductive: Prostate is visualized. Other: Small pelvic free fluid. Mesenteries and peritoneum are otherwise unremarkable. Musculoskeletal: Mild levoconvex curvature of the thoracolumbar spine may be positional. No worrisome lytic or sclerotic lesions. Presumed bone islands in both femoral heads. IMPRESSION: Acute appendicitis without evidence of rupture or abscess. Associated small pelvic free fluid. Electronically Signed   By: Lorin Picket M.D.   On: 07/17/2019 10:04    Anti-infectives: Anti-infectives (From admission, onward)   Start     Dose/Rate Route Frequency Ordered Stop   07/17/19 2100  piperacillin-tazobactam (ZOSYN) IVPB 3.375 g     3.375 g 12.5 mL/hr over 240 Minutes Intravenous Every 8 hours 07/17/19 2017     07/17/19 1200  cefTRIAXone (ROCEPHIN) 2 g in sodium chloride 0.9 % 100 mL IVPB  Status:  Discontinued     2 g 200 mL/hr over 30 Minutes Intravenous Every 24 hours 07/17/19 1104 07/17/19 2017   07/17/19 1200  metroNIDAZOLE (FLAGYL) IVPB 500 mg  Status:  Discontinued     500 mg 100 mL/hr over 60 Minutes Intravenous Every 8 hours 07/17/19 1104 07/17/19 2017      Assessment/Plan: s/p Procedure(s): APPENDECTOMY LAPAROSCOPIC (N/A)  Perforated appendicitis  Continue IV antibiotics Pain control   LOS: 0 days    Lee Santos  07/18/2019 

## 2019-07-18 NOTE — Anesthesia Postprocedure Evaluation (Signed)
Anesthesia Post Note  Patient: Lee Santos  Procedure(s) Performed: APPENDECTOMY LAPAROSCOPIC (N/A Abdomen)     Patient location during evaluation: PACU Anesthesia Type: General Level of consciousness: sedated and patient cooperative Pain management: pain level controlled Vital Signs Assessment: post-procedure vital signs reviewed and stable Respiratory status: spontaneous breathing Cardiovascular status: stable Anesthetic complications: no    Last Vitals:  Vitals:   07/18/19 0540 07/18/19 1009  BP: (!) 108/56 (!) 105/48  Pulse: (!) 58 75  Resp:  16  Temp: 36.5 C 36.5 C  SpO2: 100% 100%    Last Pain:  Vitals:   07/18/19 1009  TempSrc: Oral  PainSc:                  Nolon Nations

## 2019-07-19 NOTE — Plan of Care (Signed)

## 2019-07-19 NOTE — Progress Notes (Signed)
2 Days Post-Op   Subjective/Chief Complaint: Still with moderate pain and soreness Feels week   Objective: Vital signs in last 24 hours: Temp:  [97.7 F (36.5 C)-99.6 F (37.6 C)] 98.7 F (37.1 C) (11/15 0553) Pulse Rate:  [74-88] 75 (11/15 0553) Resp:  [16-18] 18 (11/15 0553) BP: (96-112)/(36-67) 112/54 (11/15 0553) SpO2:  [100 %] 100 % (11/15 0553) Last BM Date: (pta)  Intake/Output from previous day: 11/14 0701 - 11/15 0700 In: 2130.4 [P.O.:650; I.V.:1317; IV Piggyback:163.4] Out: -  Intake/Output this shift: No intake/output data recorded.  Exam Awake and alert in NAD Abdomen soft, mildly tender, incisions clean   Lab Results:  Recent Labs    07/16/19 2130 07/18/19 0321  WBC 11.8* 11.4*  HGB 16.4 13.8  HCT 48.1 40.2  PLT 239 184   BMET Recent Labs    07/16/19 2130 07/18/19 0321  NA 138 139  K 4.7 4.2  CL 101 103  CO2 24 24  GLUCOSE 117* 145*  BUN 8 8  CREATININE 1.15 1.12  CALCIUM 9.6 8.9   PT/INR No results for input(s): LABPROT, INR in the last 72 hours. ABG No results for input(s): PHART, HCO3 in the last 72 hours.  Invalid input(s): PCO2, PO2  Studies/Results: Ct Abdomen Pelvis W Contrast  Result Date: 07/17/2019 CLINICAL DATA:  Acute right lower quadrant pain, fever. EXAM: CT ABDOMEN AND PELVIS WITH CONTRAST TECHNIQUE: Multidetector CT imaging of the abdomen and pelvis was performed using the standard protocol following bolus administration of intravenous contrast. CONTRAST:  138mL OMNIPAQUE IOHEXOL 300 MG/ML  SOLN COMPARISON:  None. FINDINGS: Lower chest: Lung bases are clear. Heart size normal. No pericardial or pleural effusion. Hepatobiliary: Liver and gallbladder are unremarkable. No biliary ductal dilatation. Pancreas: Negative. Spleen: Negative. Adrenals/Urinary Tract: Adrenal glands and kidneys are unremarkable. Ureters are decompressed. Bladder is grossly unremarkable. Stomach/Bowel: Stomach and small bowel are unremarkable. Appendix  is dilated and fluid-filled, measuring up to 18 mm. There are at least 3 associated appendicoliths. Periappendiceal inflammatory stranding and fluid. No extraluminal air or organized fluid collection. Colon is unremarkable. Vascular/Lymphatic: Vascular structures are unremarkable. Reactive ileocolic mesenteric lymph nodes measure up to 8 mm. No pathologically enlarged lymph nodes. Reproductive: Prostate is visualized. Other: Small pelvic free fluid. Mesenteries and peritoneum are otherwise unremarkable. Musculoskeletal: Mild levoconvex curvature of the thoracolumbar spine may be positional. No worrisome lytic or sclerotic lesions. Presumed bone islands in both femoral heads. IMPRESSION: Acute appendicitis without evidence of rupture or abscess. Associated small pelvic free fluid. Electronically Signed   By: Lorin Picket M.D.   On: 07/17/2019 10:04    Anti-infectives: Anti-infectives (From admission, onward)   Start     Dose/Rate Route Frequency Ordered Stop   07/17/19 2100  piperacillin-tazobactam (ZOSYN) IVPB 3.375 g     3.375 g 12.5 mL/hr over 240 Minutes Intravenous Every 8 hours 07/17/19 2017     07/17/19 1200  cefTRIAXone (ROCEPHIN) 2 g in sodium chloride 0.9 % 100 mL IVPB  Status:  Discontinued     2 g 200 mL/hr over 30 Minutes Intravenous Every 24 hours 07/17/19 1104 07/17/19 2017   07/17/19 1200  metroNIDAZOLE (FLAGYL) IVPB 500 mg  Status:  Discontinued     500 mg 100 mL/hr over 60 Minutes Intravenous Every 8 hours 07/17/19 1104 07/17/19 2017      Assessment/Plan: s/p Procedure(s): APPENDECTOMY LAPAROSCOPIC (N/A)  Still with elevated WBC Continue IV antibiotics   LOS: 1 day    Coralie Keens 07/19/2019

## 2019-07-20 LAB — CBC
HCT: 33 % — ABNORMAL LOW (ref 39.0–52.0)
Hemoglobin: 11.3 g/dL — ABNORMAL LOW (ref 13.0–17.0)
MCH: 31.5 pg (ref 26.0–34.0)
MCHC: 34.2 g/dL (ref 30.0–36.0)
MCV: 91.9 fL (ref 80.0–100.0)
Platelets: 212 10*3/uL (ref 150–400)
RBC: 3.59 MIL/uL — ABNORMAL LOW (ref 4.22–5.81)
RDW: 11.7 % (ref 11.5–15.5)
WBC: 6.6 10*3/uL (ref 4.0–10.5)
nRBC: 0 % (ref 0.0–0.2)

## 2019-07-20 LAB — SURGICAL PATHOLOGY

## 2019-07-20 MED ORDER — OXYCODONE HCL 5 MG PO TABS: 5.0000 mg | ORAL_TABLET | Freq: Four times a day (QID) | ORAL | 0 refills | Status: DC | PRN

## 2019-07-20 MED ORDER — AMOXICILLIN-POT CLAVULANATE 875-125 MG PO TABS: 1.0000 | ORAL_TABLET | Freq: Two times a day (BID) | ORAL | 0 refills | Status: AC

## 2019-07-20 NOTE — Discharge Summary (Signed)
Physician Discharge Summary  Patient ID: Lee Santos MRN: [id number] DOB/AGE: September 20, 2000 18 y.o.  Admit date: 07/16/2019 Discharge date: 07/20/2019  Admission Diagnoses:  Discharge Diagnoses:  Active Problems:   Acute appendicitis   Perforated appendicitis   Discharged Condition: good  Hospital Course: uneventful post op recovery s/p lap appendectomy  Consults: None  Significant Diagnostic Studies:   Treatments: surgery: laparoscopic appendectomy  Discharge Exam: Blood pressure (!) 130/59, pulse 74, temperature 98.8 F (37.1 C), temperature source Oral, resp. rate 18, height 5' 10.98" (1.803 m), weight 70.3 kg, SpO2 99 %. General appearance: alert, cooperative and no distress Resp: clear to auscultation bilaterally Cardio: regular rate and rhythm, S1, S2 normal, no murmur, click, rub or gallop Incision/Wound:abdomen soft, incision clean  Disposition: Discharge disposition: 01-Home or Self Care        Allergies as of 07/20/2019      Reactions   Pollen Extract Other (See Comments)   Runny nose,itchy eyes, sneezing      Medication List    TAKE these medications   amoxicillin-clavulanate 875-125 MG tablet Commonly known as: Augmentin Take 1 tablet by mouth 2 (two) times daily for 10 doses.   oxyCODONE 5 MG immediate release tablet Commonly known as: Oxy IR/ROXICODONE Take 1 tablet (5 mg total) by mouth every 6 (six) hours as needed for moderate pain, severe pain or breakthrough pain.      Follow-up Information    [facility] Call.   Specialty: General Surgery Why: Please call to confirm your appointment time. We are working hard to arrange this for you. Please arrive 30 minutes early for follow up. Please bring a copy of your photo ID and insurance card.  Contact information: [place] [address] [phone]           Signed: Coralie Keens 07/20/2019, 7:35 AM

## 2019-07-20 NOTE — Discharge Instructions (Signed)
[facility]  Please arrive at least 30 min before your appointment to complete your check in paperwork.  If you are unable to arrive 30 min prior to your appointment time we may have to cancel or reschedule you. LAPAROSCOPIC SURGERY: POST OP INSTRUCTIONS Always review your discharge instruction sheet given to you by the facility where your surgery was performed. IF YOU HAVE DISABILITY OR FAMILY LEAVE FORMS, YOU MUST BRING THEM TO THE OFFICE FOR PROCESSING.   DO NOT GIVE THEM TO YOUR DOCTOR.  PAIN CONTROL  1. First take acetaminophen (Tylenol) AND/or ibuprofen (Advil) to control your pain after surgery.  Follow directions on package.  Taking acetaminophen (Tylenol) and/or ibuprofen (Advil) regularly after surgery will help to control your pain and lower the amount of prescription pain medication you may need.  You should not take more than 4,000 mg (4 grams) of acetaminophen (Tylenol) in 24 hours.  You should not take ibuprofen (Advil), aleve, motrin, naprosyn or other NSAIDS if you have a history of stomach ulcers or chronic kidney disease.  2. A prescription for pain medication may be given to you upon discharge.  Take your pain medication as prescribed, if you still have uncontrolled pain after taking acetaminophen (Tylenol) or ibuprofen (Advil). 3. Use ice packs to help control pain. 4. If you need a refill on your pain medication, please contact your pharmacy.  They will contact our office to request authorization. Prescriptions will not be filled after 5pm or on week-ends. 5. Take Miralax for constipation  HOME MEDICATIONS 6. Take your usually prescribed medications unless otherwise directed.  DIET 7. You should follow a light diet the first few days after arrival home.  Be sure to include lots of fluids daily. Avoid fatty, fried foods.   CONSTIPATION 8. It is common to experience some constipation after surgery and if you are taking pain medication.  Increasing  fluid intake and taking a stool softener (such as Colace) will usually help or prevent this problem from occurring.  A mild laxative (Milk of Magnesia or Miralax) should be taken according to package instructions if there are no bowel movements after 48 hours.  WOUND/INCISION CARE 9. Most patients will experience some swelling and bruising in the area of the incisions.  Ice packs will help.  Swelling and bruising can take several days to resolve.  10. Unless discharge instructions indicate otherwise, follow guidelines below  a. STERI-STRIPS - you may remove your outer bandages 48 hours after surgery, and you may shower at that time.  You have steri-strips (small skin tapes) in place directly over the incision.  These strips should be left on the skin for 7-10 days.   b. DERMABOND/SKIN GLUE - you may shower in 24 hours.  The glue will flake off over the next 2-3 weeks. 11. Any sutures or staples will be removed at the office during your follow-up visit.  ACTIVITIES 12. You may resume regular (light) daily activities beginning the next day--such as daily self-care, walking, climbing stairs--gradually increasing activities as tolerated.  You may have sexual intercourse when it is comfortable.  Refrain from any heavy lifting or straining until approved by your doctor. a. You may drive when you are no longer taking prescription pain medication, you can comfortably wear a seatbelt, and you can safely maneuver your car and apply brakes.  FOLLOW-UP 13. You should see your doctor in the office for a follow-up appointment approximately 2-3 weeks after your surgery.  You should have been  given your post-op/follow-up appointment when your surgery was scheduled.  If you did not receive a post-op/follow-up appointment, make sure that you call for this appointment within a day or two after you arrive home to insure a convenient appointment time.   WHEN TO CALL YOUR DOCTOR: 1. Fever over 101.0 2. Inability to  urinate 3. Continued bleeding from incision. 4. Increased pain, redness, or drainage from the incision. 5. Increasing abdominal pain  The clinic staff is available to answer your questions during regular business hours.  Please dont hesitate to call and ask to speak to one of the nurses for clinical concerns.  If you have a medical emergency, go to the nearest emergency room or call 911.  A surgeon from [facility] is always on call at the hospital. [address] [place], [place], [place]  [address] ? [address], [place], [place]   [address] [phone] ? [phone] ? FAX [phone]     Managing Your Pain After Surgery Without Opioids    Thank you for participating in our program to help patients manage their pain after surgery without opioids. This is part of our effort to provide you with the best care possible, without exposing you or your family to the risk that opioids pose.  What pain can I expect after surgery? You can expect to have some pain after surgery. This is normal. The pain is typically worse the day after surgery, and quickly begins to get better. Many studies have found that many patients are able to manage their pain after surgery with Over-the-Counter (OTC) medications such as Tylenol and Motrin. If you have a condition that does not allow you to take Tylenol or Motrin, notify your surgical team.  How will I manage my pain? The best strategy for controlling your pain after surgery is around the clock pain control with Tylenol (acetaminophen) and Motrin (ibuprofen or Advil). Alternating these medications with each other allows you to maximize your pain control. In addition to Tylenol and Motrin, you can use heating pads or ice packs on your incisions to help reduce your pain.  How will I alternate your regular strength over-the-counter pain medication? You will take a dose of pain medication every three hours. ; Start by taking 650  mg of Tylenol (2 pills of 325 mg) ; 3 hours later take 600 mg of Motrin (3 pills of 200 mg) ; 3 hours after taking the Motrin take 650 mg of Tylenol ; 3 hours after that take 600 mg of Motrin.   - 1 -  See example - if your first dose of Tylenol is at 12:00 PM   12:00 PM Tylenol 650 mg (2 pills of 325 mg)  3:00 PM Motrin 600 mg (3 pills of 200 mg)  6:00 PM Tylenol 650 mg (2 pills of 325 mg)  9:00 PM Motrin 600 mg (3 pills of 200 mg)  Continue alternating every 3 hours   We recommend that you follow this schedule around-the-clock for at least 3 days after surgery, or until you feel that it is no longer needed. Use the table on the last page of this handout to keep track of the medications you are taking. Important: Do not take more than 3000mg  of Tylenol or 3200mg  of Motrin in a 24-hour period. Do not take ibuprofen/Motrin if you have a history of bleeding stomach ulcers, severe kidney disease, &/or actively taking a blood thinner  What if I still have pain? If you  have pain that is not controlled with the over-the-counter pain medications (Tylenol and Motrin or Advil) you might have what we call breakthrough pain. You will receive a prescription for a small amount of an opioid pain medication such as Oxycodone, Tramadol, or Tylenol with Codeine. Use these opioid pills in the first 24 hours after surgery if you have breakthrough pain. Do not take more than 1 pill every 4-6 hours.  If you still have uncontrolled pain after using all opioid pills, don't hesitate to call our staff using the number provided. We will help make sure you are managing your pain in the best way possible, and if necessary, we can provide a prescription for additional pain medication.   Day 1    Time  Name of Medication Number of pills taken  Amount of Acetaminophen  Pain Level   Comments  AM PM       AM PM       AM PM       AM PM       AM PM       AM PM       AM PM       AM PM       Total Daily  amount of Acetaminophen Do not take more than  3,000 mg per day      Day 2    Time  Name of Medication Number of pills taken  Amount of Acetaminophen  Pain Level   Comments  AM PM       AM PM       AM PM       AM PM       AM PM       AM PM       AM PM       AM PM       Total Daily amount of Acetaminophen Do not take more than  3,000 mg per day      Day 3    Time  Name of Medication Number of pills taken  Amount of Acetaminophen  Pain Level   Comments  AM PM       AM PM       AM PM       AM PM          AM PM       AM PM       AM PM       AM PM       Total Daily amount of Acetaminophen Do not take more than  3,000 mg per day      Day 4    Time  Name of Medication Number of pills taken  Amount of Acetaminophen  Pain Level   Comments  AM PM       AM PM       AM PM       AM PM       AM PM       AM PM       AM PM       AM PM       Total Daily amount of Acetaminophen Do not take more than  3,000 mg per day      Day 5    Time  Name of Medication Number of pills taken  Amount of Acetaminophen  Pain Level   Comments  AM PM       AM PM  AM PM       AM PM       AM PM       AM PM       AM PM       AM PM       Total Daily amount of Acetaminophen Do not take more than  3,000 mg per day       Day 6    Time  Name of Medication Number of pills taken  Amount of Acetaminophen  Pain Level  Comments  AM PM       AM PM       AM PM       AM PM       AM PM       AM PM       AM PM       AM PM       Total Daily amount of Acetaminophen Do not take more than  3,000 mg per day      Day 7    Time  Name of Medication Number of pills taken  Amount of Acetaminophen  Pain Level   Comments  AM PM       AM PM       AM PM       AM PM       AM PM       AM PM       AM PM       AM PM       Total Daily amount of Acetaminophen Do not take more than  3,000 mg per day        For additional information about how and where to  safely dispose of unused opioid medications - https://www.morepowerfulnc.org  Disclaimer: This document contains information and/or instructional materials adapted from [facility] for the typical patient with your condition. It does not replace medical advice from your health care provider because your experience may differ from that of the typical patient. Talk to your health care provider if you have any questions about this document, your condition or your treatment plan. Adapted from [facility]  

## 2019-07-20 NOTE — Progress Notes (Signed)
Discharge instructions reviewed with pt and family.  Copy of instructions given to pt, informed scripts were sent to his pharmacy. Encouraged use of tylenol and ibuprofen for pain before using oxycodone.    At 1140    Pt d/c'd via wheelchair with belongings, with family.              Escorted by unit staff.

## 2019-07-20 NOTE — Progress Notes (Signed)
Patient ID: Lee Santos, male   DOB: [birth year], 18 y.o.   MRN: [id number]   Feeling better today Pain control improved Abdomen soft Incisions clean  Plan: Discharge home

## 2019-07-21 ENCOUNTER — Encounter (HOSPITAL_COMMUNITY): Payer: Self-pay | Admitting: General Surgery

## 2019-07-24 DIAGNOSIS — R300 Dysuria: Secondary | ICD-10-CM | POA: Diagnosis not present

## 2019-08-06 DIAGNOSIS — R109 Unspecified abdominal pain: Secondary | ICD-10-CM | POA: Diagnosis not present

## 2019-08-12 DIAGNOSIS — R109 Unspecified abdominal pain: Secondary | ICD-10-CM | POA: Diagnosis not present

## 2020-01-18 DIAGNOSIS — H5213 Myopia, bilateral: Secondary | ICD-10-CM | POA: Diagnosis not present

## 2020-01-18 DIAGNOSIS — H52223 Regular astigmatism, bilateral: Secondary | ICD-10-CM | POA: Diagnosis not present

## 2021-01-27 DIAGNOSIS — Z20822 Contact with and (suspected) exposure to covid-19: Secondary | ICD-10-CM | POA: Diagnosis not present

## 2021-03-03 IMAGING — CT CT ABD-PELV W/ CM
3 of 4 series · 11 of 46 positions shown, 16 images · IV contrast (APPLIED)
Comparison: None.

CLINICAL DATA: Acute right lower quadrant pain, fever.

EXAM:
CT ABDOMEN AND PELVIS WITH CONTRAST
TECHNIQUE: Multidetector CT imaging of the abdomen and pelvis was performed
using the standard protocol following bolus administration of
intravenous contrast.
CONTRAST:  100mL OMNIPAQUE IOHEXOL 300 MG/ML  SOLN

[Series 3: abdomen 5.0 · axial · 0.62mm/px · z∈[+578,+933]mm · 7 of 95 slices shown, 12 images]
[im 12/95  soft-tissue]
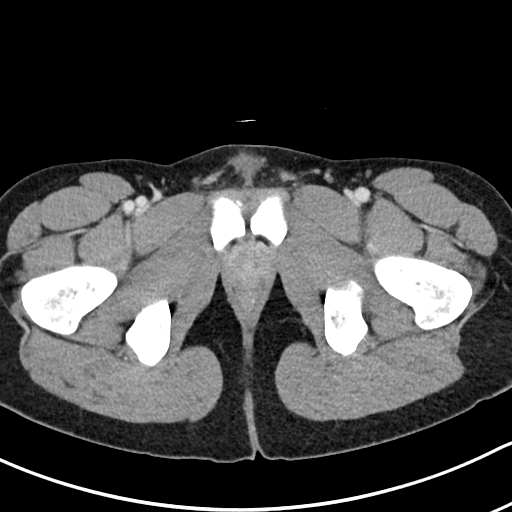
[im 12/95  bone]
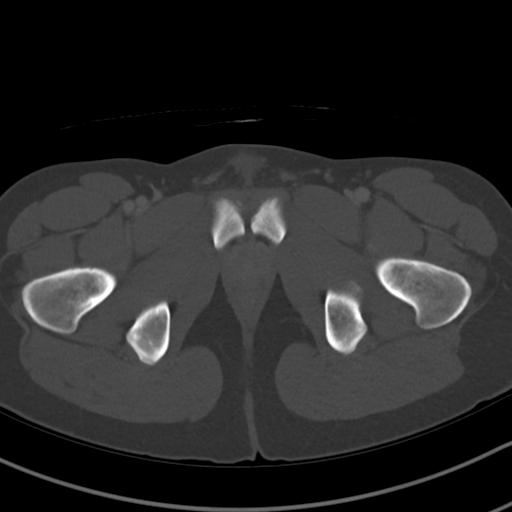
[im 24/95  soft-tissue]
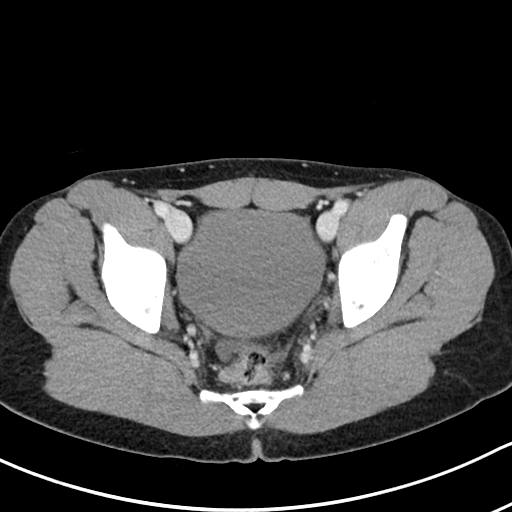
[im 36/95  soft-tissue]
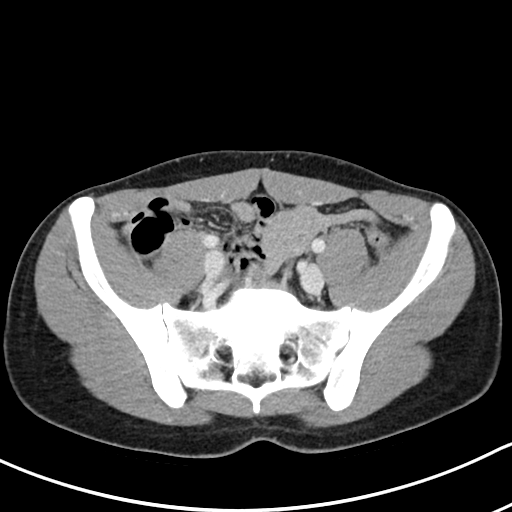
[im 48/95  soft-tissue]
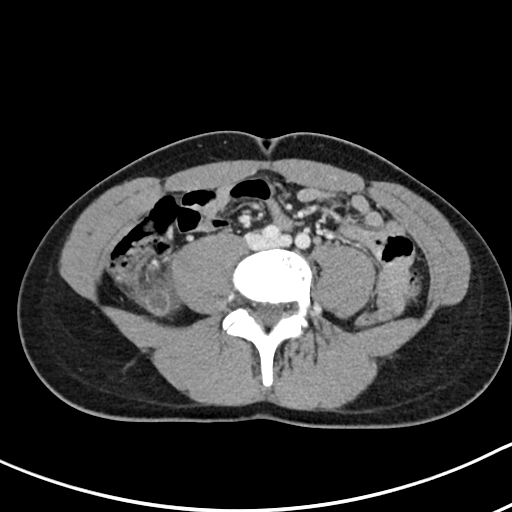
[im 48/95  lung]
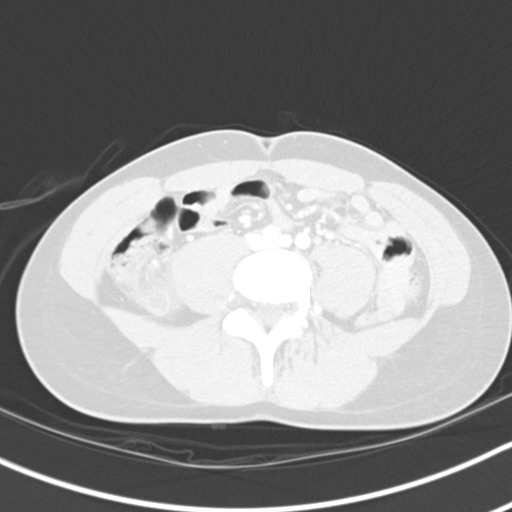
[im 59/95  soft-tissue]
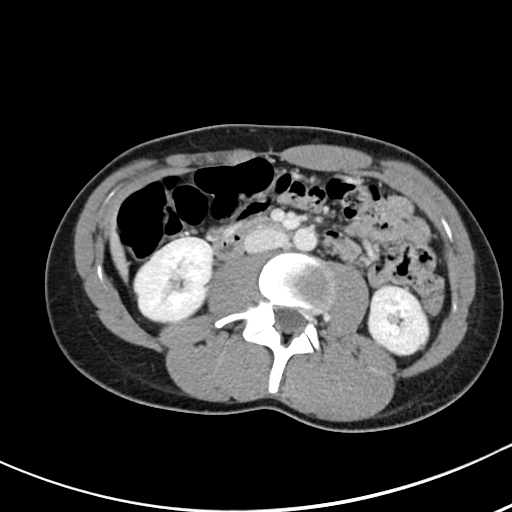
[im 59/95  lung]
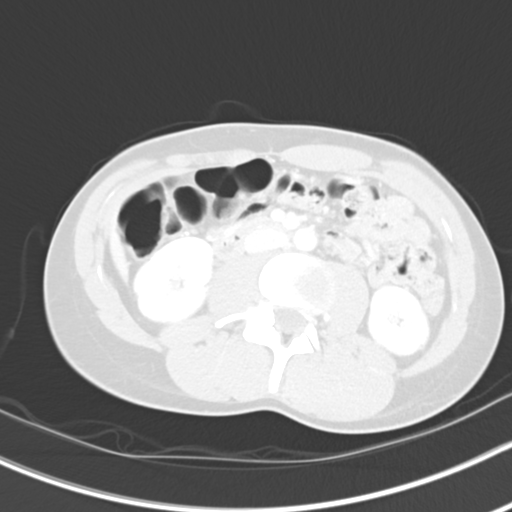
[im 71/95  soft-tissue]
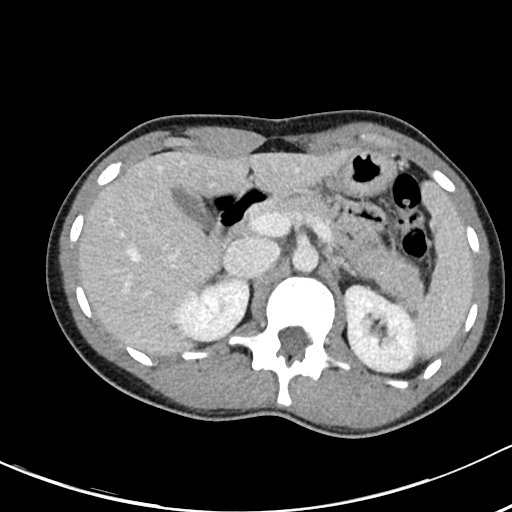
[im 71/95  lung]
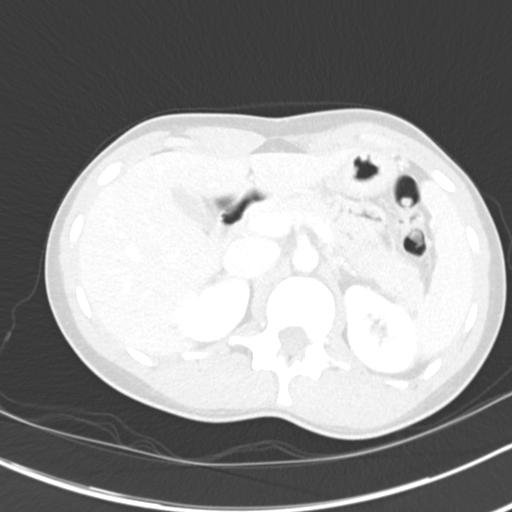
[im 83/95  soft-tissue]
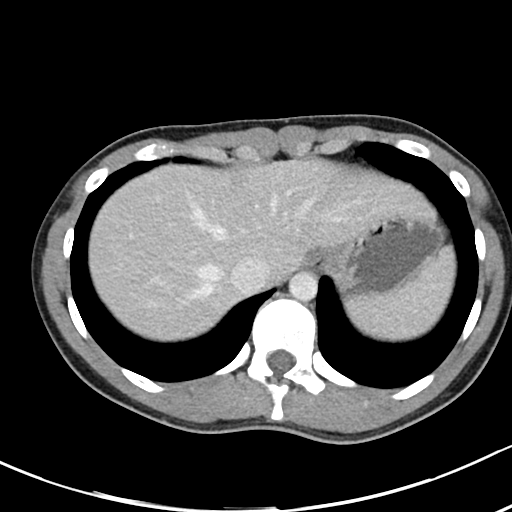
[im 83/95  lung]
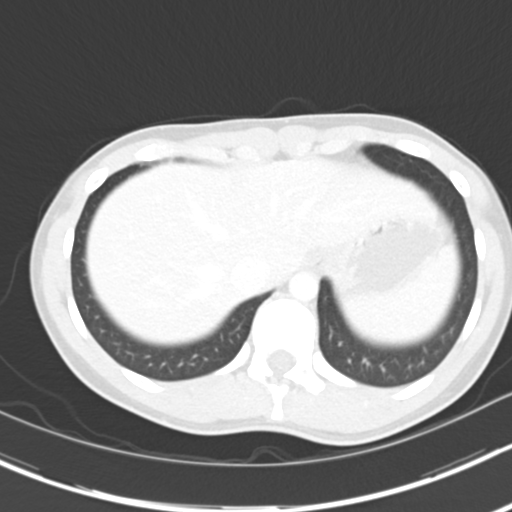

[Series 6: abdomen 3.0 mpr cor · coronal · 0.74mm/px · 3 of 76 slices shown]
[im 26/76  soft-tissue]
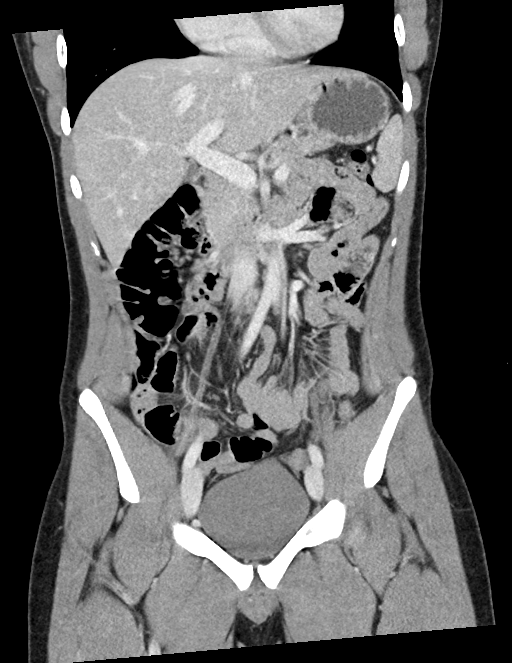
[im 34/76  soft-tissue]
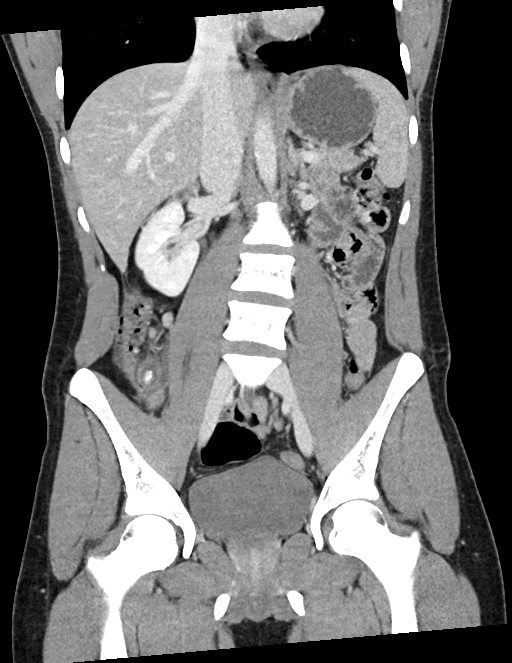
[im 42/76  soft-tissue]
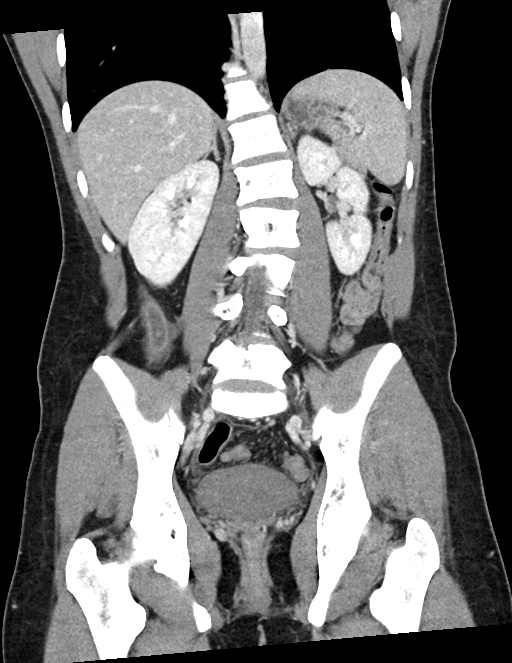

[Series 7: abdomen 3.0 mpr sag · sagittal · 0.44mm/px · 1 of 127 slices shown]
[im 45/127  soft-tissue]
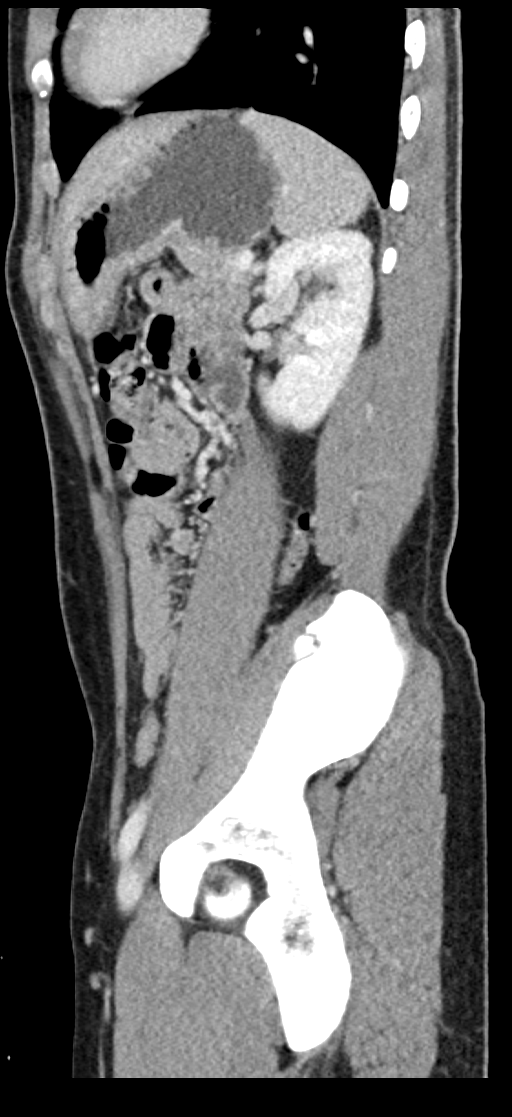

[11 of 46 positions shown; findings below may reference images not displayed]

FINDINGS: Lower chest: Lung bases are clear. Heart size normal. No pericardial
or pleural effusion.

Hepatobiliary: Liver and gallbladder are unremarkable. No biliary
ductal dilatation.

Pancreas: Negative.

Spleen: Negative.

Adrenals/Urinary Tract: Adrenal glands and kidneys are unremarkable.
Ureters are decompressed. Bladder is grossly unremarkable.

Stomach/Bowel: Stomach and small bowel are unremarkable. Appendix is
dilated and fluid-filled, measuring up to 18 mm. There are at least
3 associated appendicoliths. Periappendiceal inflammatory stranding
and fluid. No extraluminal air or organized fluid collection. Colon
is unremarkable.

Vascular/Lymphatic: Vascular structures are unremarkable. Reactive
ileocolic mesenteric lymph nodes measure up to 8 mm. No
pathologically enlarged lymph nodes.

Reproductive: Prostate is visualized.

Other: Small pelvic free fluid. Mesenteries and peritoneum are
otherwise unremarkable.

Musculoskeletal: Mild levoconvex curvature of the thoracolumbar
spine may be positional. No worrisome lytic or sclerotic lesions.
Presumed bone islands in both femoral heads.
IMPRESSION: Acute appendicitis without evidence of rupture or abscess.
Associated small pelvic free fluid.

## 2021-05-04 ENCOUNTER — Telehealth: Payer: Self-pay

## 2021-05-04 DIAGNOSIS — Z09 Encounter for follow-up examination after completed treatment for conditions other than malignant neoplasm: Secondary | ICD-10-CM

## 2021-05-04 NOTE — Telephone Encounter (Signed)
[facility] mailed adolescent transition dismissal letter to pt, marked dismissed  from practice in pt's chart due to age. Can be seen by [facility] until age 20 if needed.   Oswin Johal, BSW, QP Case Manager [facility] Office: [phone] Direct Number: [phone]  

## 2021-06-29 ENCOUNTER — Ambulatory Visit (HOSPITAL_COMMUNITY)
Admission: EM | Admit: 2021-06-29 | Discharge: 2021-06-29 | Disposition: A | Discharge status: Home / Self Care | Payer: Medicaid Other

## 2021-06-29 ENCOUNTER — Other Ambulatory Visit: Payer: Self-pay

## 2021-06-29 ENCOUNTER — Encounter (HOSPITAL_COMMUNITY): Payer: Self-pay

## 2021-06-29 DIAGNOSIS — J069 Acute upper respiratory infection, unspecified: Secondary | ICD-10-CM

## 2021-06-29 NOTE — Discharge Instructions (Addendum)
-  With a virus, you're typically contagious for 5-7 days, or as long as you're having fevers.  -You can take Tylenol up to 1000 mg 3 times daily, and ibuprofen up to 600 mg 3 times daily with food.  You can take these together, or alternate every 3-4 hours. Mucinex, dayquil, nyquil, etc -Follow-up if symptoms getting worse instead of better.

## 2021-06-29 NOTE — ED Provider Notes (Signed)
[facility]    CSN: [id number] Arrival date & time: 06/29/21  1406      History   Chief Complaint Chief Complaint  Patient presents with   Headache   Fever   Cough   decreased appetite    HPI Lee Santos is a 20 y.o. male presenting with viral symptoms for 2 days.  Medical history noncontributory.  Notes nasal congestion, decreased appetite, sore throat, nonproductive cough, throbbing headaches.  Has been taking some over-the-counter daytime cough medicine with minimal relief.  Denies worst headache of life, vision changes, dizziness, weakness.  Denies shortness of breath, chest pain.  Denies history of cardiopulmonary disease.  Does note recent exposure to brother who had similar symptoms but is feeling better already.  HPI  Past Medical History:  Diagnosis Date   Medical history non-contributory     Patient Active Problem List   Diagnosis Date Noted   Acute appendicitis 07/17/2019   Perforated appendicitis 07/17/2019   Weight loss 03/26/2017    Past Surgical History:  Procedure Laterality Date   LAPAROSCOPIC APPENDECTOMY N/A 07/17/2019   Procedure: APPENDECTOMY LAPAROSCOPIC;  Surgeon: Emelia Loron, MD;  Location: [facility];  Service: General;  Laterality: N/A;       Home Medications    Prior to Admission medications   Not on File    Family History Family History  Problem Relation Age of Onset   Hypertension Father    Hyperlipidemia Father    Heart disease Paternal Grandfather    Diabetes Maternal Grandfather     Social History Social History   Tobacco Use   Smoking status: Never   Smokeless tobacco: Never     Allergies   Pollen extract   Review of Systems Review of Systems  Constitutional:  Positive for chills. Negative for appetite change and fever.  HENT:  Positive for congestion and sore throat. Negative for ear pain, rhinorrhea, sinus pressure and sinus pain.   Eyes:  Negative for redness and visual disturbance.   Respiratory:  Positive for cough. Negative for chest tightness, shortness of breath and wheezing.   Cardiovascular:  Negative for chest pain and palpitations.  Gastrointestinal:  Negative for abdominal pain, constipation, diarrhea, nausea and vomiting.  Genitourinary:  Negative for dysuria, frequency and urgency.  Musculoskeletal:  Negative for myalgias.  Neurological:  Negative for dizziness, weakness and headaches.  Psychiatric/Behavioral:  Negative for confusion.   All other systems reviewed and are negative.   Physical Exam Triage Vital Signs ED Triage Vitals  Enc Vitals Group     BP 06/29/21 1554 113/77     Pulse Rate 06/29/21 1554 96     Resp 06/29/21 1554 16     Temp 06/29/21 1554 98.4 F (36.9 C)     Temp Source 06/29/21 1554 Oral     SpO2 06/29/21 1554 97 %     Weight --      Height --      Head Circumference --      Peak Flow --      Pain Score 06/29/21 1555 6     Pain Loc --      Pain Edu? --      Excl. in GC? --    No data found.  Updated Vital Signs BP 113/77 (BP Location: Left Arm)   Pulse 96   Temp 98.4 F (36.9 C) (Oral)   Resp 16   SpO2 97%   Visual Acuity Right Eye Distance:   Left Eye Distance:  Bilateral Distance:    Right Eye Near:   Left Eye Near:    Bilateral Near:     Physical Exam Vitals reviewed.  Constitutional:      General: He is not in acute distress.    Appearance: Normal appearance. He is not ill-appearing.  HENT:     Head: Normocephalic and atraumatic.     Right Ear: Tympanic membrane, ear canal and external ear normal. No tenderness. No middle ear effusion. There is no impacted cerumen. Tympanic membrane is not perforated, erythematous, retracted or bulging.     Left Ear: Tympanic membrane, ear canal and external ear normal. No tenderness.  No middle ear effusion. There is no impacted cerumen. Tympanic membrane is not perforated, erythematous, retracted or bulging.     Nose: Nose normal. No congestion.     Mouth/Throat:      Mouth: Mucous membranes are moist.     Pharynx: Uvula midline. Posterior oropharyngeal erythema present. No oropharyngeal exudate.  Eyes:     Extraocular Movements: Extraocular movements intact.     Pupils: Pupils are equal, round, and reactive to light.  Cardiovascular:     Rate and Rhythm: Normal rate and regular rhythm.     Heart sounds: Normal heart sounds.  Pulmonary:     Effort: Pulmonary effort is normal.     Breath sounds: Normal breath sounds. No decreased breath sounds, wheezing, rhonchi or rales.  Abdominal:     Palpations: Abdomen is soft.     Tenderness: There is no abdominal tenderness. There is no guarding or rebound.  Lymphadenopathy:     Cervical: No cervical adenopathy.     Right cervical: No superficial cervical adenopathy.    Left cervical: No superficial cervical adenopathy.  Neurological:     General: No focal deficit present.     Mental Status: He is alert and oriented to person, place, and time.  Psychiatric:        Mood and Affect: Mood normal.        Behavior: Behavior normal.        Thought Content: Thought content normal.        Judgment: Judgment normal.     UC Treatments / Results  Labs (all labs ordered are listed, but only abnormal results are displayed) Labs Reviewed - No data to display  EKG   Radiology No results found.  Procedures Procedures (including critical care time)  Medications Ordered in UC Medications - No data to display  Initial Impression / Assessment and Plan / UC Course  I have reviewed the triage vital signs and the nursing notes.  Pertinent labs & imaging results that were available during my care of the patient were reviewed by me and considered in my medical decision making (see chart for details).     This patient is a very pleasant 20 y.o. year old male presenting with viral syndrome. Today this pt is afebrile nontachycardic nontachypneic, oxygenating well on room air, no wheezes rhonchi or rales. Has  not taken antipyretic.   Declines covid/influenza testing. Prefers over-the-counter medications for symptomatic relief.  Work note provided. ED return precautions discussed. Patient verbalizes understanding and agreement.   .   Final Clinical Impressions(s) / UC Diagnoses   Final diagnoses:  Viral URI with cough     Discharge Instructions      -With a virus, you're typically contagious for 5-7 days, or as long as you're having fevers.  -You can take Tylenol up to 1000 mg 3 times daily,  and ibuprofen up to 600 mg 3 times daily with food.  You can take these together, or alternate every 3-4 hours. Mucinex, dayquil, nyquil, etc -Follow-up if symptoms getting worse instead of better.   ED Prescriptions   None    PDMP not reviewed this encounter.   Rhys Martini, PA-C 06/29/21 1610

## 2021-06-29 NOTE — ED Triage Notes (Signed)
Pt is c/o of headache that started Monday and has persisted; pt stated that on Sunday, he had street tacos and felt nauseated after and vomited; on Monday he woke up with a headache, no longer nauseous but decreased appetite, and as the week progressed, he developed fever, body aches, and cough
